# Patient Record
Sex: Female | Born: 1990 | Race: White | Hispanic: No | Marital: Single | Smoking: Never smoker
Health system: Southern US, Community
[De-identification: ages and names within clinical notes are randomized; demographics above are authoritative.]

---
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
## 2010-05-02 ENCOUNTER — Encounter: Admission: RE | Admit: 2010-05-02 | Discharge: 2010-05-02 | Payer: Self-pay | Admitting: Family Medicine

## 2012-12-11 ENCOUNTER — Other Ambulatory Visit (HOSPITAL_COMMUNITY)
Admission: RE | Admit: 2012-12-11 | Discharge: 2012-12-11 | Disposition: A | Discharge status: Home / Self Care | Payer: BC Managed Care – PPO | Source: Ambulatory Visit | Attending: Family Medicine | Admitting: Family Medicine

## 2012-12-11 DIAGNOSIS — Z124 Encounter for screening for malignant neoplasm of cervix: Secondary | ICD-10-CM | POA: Insufficient documentation

## 2015-02-11 ENCOUNTER — Other Ambulatory Visit (HOSPITAL_COMMUNITY)
Admission: RE | Admit: 2015-02-11 | Discharge: 2015-02-11 | Disposition: A | Discharge status: Home / Self Care | Payer: BC Managed Care – PPO | Source: Ambulatory Visit | Attending: Family Medicine | Admitting: Family Medicine

## 2015-02-11 ENCOUNTER — Other Ambulatory Visit: Payer: Self-pay | Admitting: Family Medicine

## 2015-02-11 DIAGNOSIS — Z124 Encounter for screening for malignant neoplasm of cervix: Secondary | ICD-10-CM | POA: Insufficient documentation

## 2015-02-14 LAB — CYTOLOGY - PAP

## 2018-05-20 ENCOUNTER — Other Ambulatory Visit: Payer: Self-pay | Admitting: Family Medicine

## 2018-05-20 ENCOUNTER — Other Ambulatory Visit (HOSPITAL_COMMUNITY)
Admission: RE | Admit: 2018-05-20 | Discharge: 2018-05-20 | Disposition: A | Discharge status: Home / Self Care | Payer: BC Managed Care – PPO | Source: Ambulatory Visit | Attending: Family Medicine | Admitting: Family Medicine

## 2018-05-20 DIAGNOSIS — Z01411 Encounter for gynecological examination (general) (routine) with abnormal findings: Secondary | ICD-10-CM | POA: Insufficient documentation

## 2018-05-22 LAB — CYTOLOGY - PAP: Diagnosis: NEGATIVE

## 2020-03-26 ENCOUNTER — Ambulatory Visit: Payer: Self-pay | Attending: Internal Medicine

## 2020-03-26 DIAGNOSIS — Z23 Encounter for immunization: Secondary | ICD-10-CM

## 2020-03-26 NOTE — Progress Notes (Signed)
   Covid-19 Vaccination Clinic  Name:  BERNEDA PICCININNI    MRN: [id number] DOB: [birth year]  03/26/2020  Ms. Gloss was observed post Covid-19 immunization for 15 minutes without incident. She was provided with Vaccine Information Sheet and instruction to access the V-Safe system.   Ms. Salmon was instructed to call 911 with any severe reactions post vaccine: Marland Kitchen Difficulty breathing  . Swelling of face and throat  . A fast heartbeat  . A bad rash all over body  . Dizziness and weakness   Immunizations Administered    Name Date Dose VIS Date Route   Pfizer COVID-19 Vaccine 03/26/2020 11:20 AM 0.3 mL 12/04/2019 Intramuscular   Manufacturer: [employer], [employer]   Lot: [id number]   NDC: [address]-2

## 2020-04-19 ENCOUNTER — Ambulatory Visit: Payer: Self-pay | Attending: Internal Medicine

## 2020-04-19 ENCOUNTER — Ambulatory Visit: Payer: Self-pay

## 2020-04-19 DIAGNOSIS — Z23 Encounter for immunization: Secondary | ICD-10-CM

## 2020-04-19 NOTE — Progress Notes (Signed)
   Covid-19 Vaccination Clinic  Name:  Nicole Arellano    MRN: [id number] DOB: 01/14/1991  04/19/2020  Ms. Nicole Arellano was observed post Covid-19 immunization for 15 minutes without incident. She was provided with Vaccine Information Sheet and instruction to access the V-Safe system.   Nicole Arellano was instructed to call 911 with any severe reactions post vaccine: Marland Kitchen Difficulty breathing  . Swelling of face and throat  . A fast heartbeat  . A bad rash all over body  . Dizziness and weakness   Immunizations Administered    Name Date Dose VIS Date Route   Pfizer COVID-19 Vaccine 04/19/2020  3:27 PM 0.3 mL 02/17/2019 Intramuscular   Manufacturer: [employer], [employer]   Lot: [id number]   NDC: [address]-7

## 2021-09-29 ENCOUNTER — Other Ambulatory Visit (HOSPITAL_COMMUNITY)
Admission: RE | Admit: 2021-09-29 | Discharge: 2021-09-29 | Disposition: A | Discharge status: Home / Self Care | Payer: No Typology Code available for payment source | Source: Ambulatory Visit | Attending: Family Medicine | Admitting: Family Medicine

## 2021-09-29 ENCOUNTER — Other Ambulatory Visit: Payer: Self-pay | Admitting: Family Medicine

## 2021-09-29 DIAGNOSIS — Z01411 Encounter for gynecological examination (general) (routine) with abnormal findings: Secondary | ICD-10-CM | POA: Diagnosis not present

## 2021-10-03 LAB — CYTOLOGY - PAP
Comment: NEGATIVE
Diagnosis: NEGATIVE
High risk HPV: NEGATIVE

## 2023-02-25 ENCOUNTER — Encounter: Payer: Self-pay | Admitting: Nurse Practitioner

## 2023-02-25 ENCOUNTER — Ambulatory Visit (INDEPENDENT_AMBULATORY_CARE_PROVIDER_SITE_OTHER): Payer: 59 | Admitting: Nurse Practitioner

## 2023-02-25 VITALS — BP 112/76 | HR 106 | Ht 66.0 in | Wt 131.2 lb

## 2023-02-25 DIAGNOSIS — Z130 Encounter for screening for diseases of the blood and blood-forming organs and certain disorders involving the immune mechanism: Secondary | ICD-10-CM

## 2023-02-25 DIAGNOSIS — R8290 Unspecified abnormal findings in urine: Secondary | ICD-10-CM

## 2023-02-25 DIAGNOSIS — Z1329 Encounter for screening for other suspected endocrine disorder: Secondary | ICD-10-CM | POA: Diagnosis not present

## 2023-02-25 DIAGNOSIS — Z1321 Encounter for screening for nutritional disorder: Secondary | ICD-10-CM | POA: Diagnosis not present

## 2023-02-25 DIAGNOSIS — Z0000 Encounter for general adult medical examination without abnormal findings: Secondary | ICD-10-CM

## 2023-02-25 DIAGNOSIS — Z13228 Encounter for screening for other metabolic disorders: Secondary | ICD-10-CM

## 2023-02-25 LAB — CBC WITH DIFFERENTIAL/PLATELET
Hematocrit: 42.5 % (ref 34.0–46.6)
Platelets: 435 10*3/uL (ref 150–450)

## 2023-02-25 LAB — POCT URINALYSIS DIP (CLINITEK)
Bilirubin, UA: NEGATIVE
Glucose, UA: NEGATIVE mg/dL
Ketones, POC UA: NEGATIVE mg/dL
Leukocytes, UA: NEGATIVE
Nitrite, UA: NEGATIVE
POC PROTEIN,UA: NEGATIVE
Spec Grav, UA: <=1.005 — AB (ref 1.010–1.025)
Urobilinogen, UA: 0.2 E.U./dL
pH, UA: 7 (ref 5.0–8.0)

## 2023-02-25 LAB — B12 AND FOLATE PANEL

## 2023-02-25 LAB — TSH

## 2023-02-25 LAB — HEMOGLOBIN A1C

## 2023-02-25 LAB — LIPID PANEL

## 2023-02-25 LAB — COMPREHENSIVE METABOLIC PANEL

## 2023-02-25 LAB — IRON,TIBC AND FERRITIN PANEL

## 2023-02-25 NOTE — Assessment & Plan Note (Signed)
Nicole Arellano has concerns with unintentional weight gain, bloating, fatigue, decreased energy, brittle nails, palpitations, and fullness on the throat that she initially noted about a year ago. She has been taking a daily supplement to help with vitamins with no change in symptoms.  Plan: Monitor labs today for possible etiology contributing to current symptoms.  Will make changes to the plan of care based on findings as appropriate.

## 2023-02-25 NOTE — Assessment & Plan Note (Signed)

## 2023-02-25 NOTE — Progress Notes (Signed)
Worthy Keeler, DNP, AGNP-c [place], [place] [address] Main Office [phone]  BP 112/76   Pulse (!) 106   Ht '5\' 6"'$  (1.676 m)   Wt 131 lb 3.2 oz (59.5 kg)   LMP 02/06/2023   BMI 21.18 kg/m    Subjective:    Patient ID: Nicole Arellano, female    DOB: Sep 14, 1991, 32 y.o.   MRN: [id number]  HPI: Nicole Arellano is a 32 y.o. female presenting on 02/25/2023 for comprehensive medical examination. She is a new patient with this practice today.   Current medical concerns include: Nicole Arellano presents today as a new patient for a physical examination and reports a significant weight gain of almost 20 pounds since last April, which she considers unusual for her. She experiences constant bloating that does not seem to be affected by dietary changes, including an unsuccessful trial of eliminating dairy and other common food allergens. Additionally, the patient is dealing with persistent fatigue that has led to the need for daily naps and a decrease in energy, preventing her from maintaining her previous running routine. She also observes that her nails have stopped growing long as they once did.  The patient has had difficulty swallowing over the past year and is uncertain if this symptom is associated with anxiety. She reports experiencing occasional palpitations while at rest, with about three episodes since January. There are no reported changes in the coloration of her fingertips. Her menstrual cycles are regular and light, attributed to her use of birth control, and she is currently on spironolactone 50 mg.   She acknowledges malodorous urine for the past few days and reports a history of frequent UTI's int he past. She is not having any dysuria, pelvic pain/pressure. low back pain.  IMMUNIZATIONS:   Flu: Flu vaccine completed elsewhere this season Prevnar 13: Prevnar 13 N/A for this patient Prevnar 20: Prevnar 20 N/A for this patient Pneumovax 23:  Pneumovax 23 N/A for this patient Vac Shingrix: Shingrix N/A for this patient HPV: HPV N/A for this patient Tetanus: Tetanus completed in the last 10 years  HEALTH MAINTENANCE: Pap Smear HM Status: is up to date Mammogram HM Status: is not applicable for this patient Colon Cancer Screening HM Status: is not applicable for this patient Bone Density HM Status: is not applicable for this patient STI Testing HM Status: was declined   She reports regular vision exams q1-5y:  No vision She reports regular dental exams q 50m  Yes  The patient eats a regular, healthy diet. She endorses exercise and/or activity of:  walking 30 minutes a day  She currently: Marital Status: single Living situation: with family Sexual: not sexually active Occupation: [employer] and exercise psychologist Pertinent items are noted in HPI.  Most Recent Depression Screen:     02/25/2023    1:52 PM  Depression screen PHQ 2/9  Decreased Interest 0  Down, Depressed, Hopeless 0  PHQ - 2 Score 0   Most Recent Anxiety Screen:      No data to display         Most Recent Fall Screen:    02/25/2023    1:52 PM  Fall Risk   Falls in the past year? 0  Number falls in past yr: 0  Injury with Fall? 0  Risk for fall due to : No Fall Risks  Follow up Falls evaluation completed    Past medical history, surgical history, medications, allergies, family history and social history reviewed with  patient today and changes made to appropriate areas of the chart.  Past Medical History:  History reviewed. No pertinent past medical history. Medications:  Current Outpatient Medications on File Prior to Visit  Medication Sig   Multiple Vitamin (MULTIVITAMIN) tablet Take 1 tablet by mouth daily.   norethindrone-ethinyl estradiol-FE (LOESTRIN FE) 1-20 MG-MCG tablet Take 1 tablet by mouth daily.   spironolactone (ALDACTONE) 50 MG tablet Take 50 mg by mouth daily.   No current facility-administered medications on file prior to visit.    Surgical History:  History reviewed. No pertinent surgical history. Allergies:  Not on File Family History:  History reviewed. No pertinent family history.     Objective:    BP 112/76   Pulse (!) 106   Ht '5\' 6"'$  (1.676 m)   Wt 131 lb 3.2 oz (59.5 kg)   LMP 02/06/2023   BMI 21.18 kg/m   Wt Readings from Last 3 Encounters:  02/25/23 131 lb 3.2 oz (59.5 kg)    Physical Exam Vitals and nursing note reviewed.  Constitutional:      General: She is not in acute distress.    Appearance: Normal appearance.  HENT:     Head: Normocephalic and atraumatic.     Right Ear: Hearing, tympanic membrane, ear canal and external ear normal.     Left Ear: Hearing, tympanic membrane, ear canal and external ear normal.     Nose: Nose normal.     Right Sinus: No maxillary sinus tenderness or frontal sinus tenderness.     Left Sinus: No maxillary sinus tenderness or frontal sinus tenderness.     Mouth/Throat:     Lips: Pink.     Mouth: Mucous membranes are moist.     Pharynx: Oropharynx is clear.  Eyes:     General: Lids are normal. Vision grossly intact.     Extraocular Movements: Extraocular movements intact.     Conjunctiva/sclera: Conjunctivae normal.     Pupils: Pupils are equal, round, and reactive to light.     Funduscopic exam:    Right eye: Red reflex present.        Left eye: Red reflex present.    Visual Fields: Right eye visual fields normal and left eye visual fields normal.  Neck:     Thyroid: No thyromegaly.     Vascular: No carotid bruit.  Cardiovascular:     Rate and Rhythm: Normal rate and regular rhythm.     Chest Wall: PMI is not displaced.     Pulses: Normal pulses.          Dorsalis pedis pulses are 2+ on the right side and 2+ on the left side.       Posterior tibial pulses are 2+ on the right side and 2+ on the left side.     Heart sounds: Normal heart sounds. No murmur heard. Pulmonary:     Effort: Pulmonary effort is normal. No respiratory distress.      Breath sounds: Normal breath sounds.  Abdominal:     General: Abdomen is flat. Bowel sounds are normal. There is no distension.     Palpations: Abdomen is soft. There is no hepatomegaly, splenomegaly or mass.     Tenderness: There is no abdominal tenderness. There is no right CVA tenderness, left CVA tenderness, guarding or rebound.  Musculoskeletal:        General: Normal range of motion.     Cervical back: Full passive range of motion without pain, normal range of motion  and neck supple. No tenderness.     Right lower leg: No edema.     Left lower leg: No edema.  Feet:     Left foot:     Toenail Condition: Left toenails are normal.  Lymphadenopathy:     Cervical: No cervical adenopathy.     Upper Body:     Right upper body: No supraclavicular adenopathy.     Left upper body: No supraclavicular adenopathy.  Skin:    General: Skin is warm and dry.     Capillary Refill: Capillary refill takes less than 2 seconds.     Nails: There is no clubbing.  Neurological:     General: No focal deficit present.     Mental Status: She is alert and oriented to person, place, and time.     GCS: GCS eye subscore is 4. GCS verbal subscore is 5. GCS motor subscore is 6.     Sensory: Sensation is intact.     Motor: Motor function is intact.     Coordination: Coordination is intact.     Gait: Gait is intact.     Deep Tendon Reflexes: Reflexes are normal and symmetric.  Psychiatric:        Attention and Perception: Attention normal.        Mood and Affect: Mood normal.        Speech: Speech normal.        Behavior: Behavior normal. Behavior is cooperative.        Thought Content: Thought content normal.        Cognition and Memory: Cognition and memory normal.        Judgment: Judgment normal.     Results for orders placed or performed in visit on 02/25/23  POCT URINALYSIS DIP (CLINITEK)  Result Value Ref Range   Color, UA yellow yellow   Clarity, UA clear clear   Glucose, UA negative  negative mg/dL   Bilirubin, UA negative negative   Ketones, POC UA negative negative mg/dL   Spec Grav, UA <=1.005 (A) 1.010 - 1.025   Blood, UA small (A) negative   pH, UA 7.0 5.0 - 8.0   POC PROTEIN,UA negative negative, trace   Urobilinogen, UA 0.2 0.2 or 1.0 E.U./dL   Nitrite, UA Negative Negative   Leukocytes, UA Negative Negative         Assessment & Plan:   Problem List Items Addressed This Visit     Encounter for annual physical exam - Primary    CPE today with no abnormalities noted on exam.  Labs pending. Will make changes as necessary based on results.  Review of HM activities and recommendations discussed and provided on AVS Anticipatory guidance, diet, and exercise recommendations provided.  Medications, allergies, and hx reviewed and updated as necessary.  Plan to f/u with CPE in 1 year or sooner for acute/chronic health needs as directed.        Screening for endocrine, nutritional, metabolic and immunity disorder    Aaditri has concerns with unintentional weight gain, bloating, fatigue, decreased energy, brittle nails, palpitations, and fullness on the throat that she initially noted about a year ago. She has been taking a daily supplement to help with vitamins with no change in symptoms.  Plan: Monitor labs today for possible etiology contributing to current symptoms.  Will make changes to the plan of care based on findings as appropriate.       Relevant Orders   CBC with Differential/Platelet   Comprehensive metabolic  panel   Lipid panel   Hemoglobin A1c   TSH   T4, free   VITAMIN D 25 Hydroxy (Vit-D Deficiency, Fractures)   B12 and Folate Panel   Iron, TIBC and Ferritin Panel   Urine malodor    Alteration in urine odor with no other associated symptoms. Plan: UA today for evaluation.       Relevant Orders   POCT URINALYSIS DIP (CLINITEK) (Completed)   Other Visit Diagnoses     Health care maintenance       Relevant Orders   CBC with  Differential/Platelet   Comprehensive metabolic panel   Lipid panel   Hemoglobin A1c   TSH   T4, free   VITAMIN D 25 Hydroxy (Vit-D Deficiency, Fractures)   B12 and Folate Panel   Iron, TIBC and Ferritin Panel          Follow up plan: Return in about 1 year (around 02/25/2024) for CPE.  NEXT PREVENTATIVE PHYSICAL DUE IN 1 YEAR.  PATIENT COUNSELING PROVIDED FOR ALL ADULT PATIENTS:  Consume a well balanced diet low in saturated fats, cholesterol, and moderation in carbohydrates.   This can be as simple as monitoring portion sizes and cutting back on sugary beverages such as soda and juice to start with.    Daily water consumption of at least 64 ounces.  Physical activity at least 180 minutes per week, if just starting out.   This can be as simple as taking the stairs instead of the elevator and walking 2-3 laps around the office  purposefully every day.   STD protection, partner selection, and regular testing if high risk.  Limited consumption of alcoholic beverages if alcohol is consumed.  For women, I recommend no more than 7 alcoholic beverages per week, spread out throughout the week.  Avoid "binge" drinking or consuming large quantities of alcohol in one setting.   Please let me know if you feel you may need help with reduction or quitting alcohol consumption.   Avoidance of nicotine, if used.  Please let me know if you feel you may need help with reduction or quitting nicotine use.   Daily mental health attention.  This can be in the form of 5 minute daily meditation, prayer, journaling, yoga, reflection, etc.   Purposeful attention to your emotions and mental state can significantly improve your overall wellbeing and Health.  Please know that I am here to help you with all of your health care goals and am happy to work with you to find a solution that works best for you.  The greatest advice I have received with any changes in life are to take it one step at a time,  that even means if all you can focus on is the next 60 seconds, then do that and celebrate your victories.  With any changes in life, you will have set backs, and that is OK. The important thing to remember is, if you have a set back, it is not a failure, it is an opportunity to try again!  Health Maintenance Recommendations Screening Testing Mammogram Every 1 -2 years based on history and risk factors Starting at age 56 Pap Smear Ages 21-39 every 3 years Ages 43-65 every 5 years with HPV testing More frequent testing may be required based on results and history Colon Cancer Screening Every 1-10 years based on test performed, risk factors, and history Starting at age 29 Bone Density Screening Every 2-10 years based on history Starting at age 64  for women Recommendations for men differ based on medication usage, history, and risk factors AAA Screening One time ultrasound Men 13-30 years old who have every smoked Lung Cancer Screening Low Dose Lung CT every 12 months Age 9-80 years with a 30 pack-year smoking history who still smoke or who have quit within the last 15 years  Screening Labs Routine  Labs: Complete Blood Count (CBC), Complete Metabolic Panel (CMP), Cholesterol (Lipid Panel) Every 6-12 months based on history and medications May be recommended more frequently based on current conditions or previous results Hemoglobin A1c Lab Every 3-12 months based on history and previous results Starting at age 44 or earlier with diagnosis of diabetes, high cholesterol, BMI >26, and/or risk factors Frequent monitoring for patients with diabetes to ensure blood sugar control Thyroid Panel (TSH w/ T3 & T4) Every 6 months based on history, symptoms, and risk factors May be repeated more often if on medication HIV One time testing for all patients 49 and older May be repeated more frequently for patients with increased risk factors or exposure Hepatitis C One time testing for all  patients 64 and older May be repeated more frequently for patients with increased risk factors or exposure Gonorrhea, Chlamydia Every 12 months for all sexually active persons 13-24 years Additional monitoring may be recommended for those who are considered high risk or who have symptoms PSA Men 24-21 years old with risk factors Additional screening may be recommended from age 47-69 based on risk factors, symptoms, and history  Vaccine Recommendations Tetanus Booster All adults every 10 years Flu Vaccine All patients 6 months and older every year COVID Vaccine All patients 12 years and older Initial dosing with booster May recommend additional booster based on age and health history HPV Vaccine 2 doses all patients age 36-26 Dosing may be considered for patients over 26 Shingles Vaccine (Shingrix) 2 doses all adults 38 years and older Pneumonia (Pneumovax 23) All adults 29 years and older May recommend earlier dosing based on health history Pneumonia (Prevnar 64) All adults 16 years and older Dosed 1 year after Pneumovax 23  Additional Screening, Testing, and Vaccinations may be recommended on an individualized basis based on family history, health history, risk factors, and/or exposure.

## 2023-02-25 NOTE — Assessment & Plan Note (Signed)
Alteration in urine odor with no other associated symptoms. Plan: UA today for evaluation.

## 2023-02-26 LAB — CBC WITH DIFFERENTIAL/PLATELET
Basophils Absolute: 0.1 10*3/uL (ref 0.0–0.2)
Basos: 1 %
EOS (ABSOLUTE): 0.2 10*3/uL (ref 0.0–0.4)
Eos: 3 %
Hemoglobin: 14.3 g/dL (ref 11.1–15.9)
Immature Grans (Abs): 0 10*3/uL (ref 0.0–0.1)
Immature Granulocytes: 0 %
Lymphocytes Absolute: 2.5 10*3/uL (ref 0.7–3.1)
Lymphs: 35 %
MCH: 30.7 pg (ref 26.6–33.0)
MCHC: 33.6 g/dL (ref 31.5–35.7)
MCV: 91 fL (ref 79–97)
Monocytes Absolute: 0.5 10*3/uL (ref 0.1–0.9)
Monocytes: 6 %
Neutrophils Absolute: 3.8 10*3/uL (ref 1.4–7.0)
Neutrophils: 55 %
RBC: 4.66 x10E6/uL (ref 3.77–5.28)
RDW: 12.8 % (ref 11.7–15.4)
WBC: 7.1 10*3/uL (ref 3.4–10.8)

## 2023-02-26 LAB — COMPREHENSIVE METABOLIC PANEL
ALT: 13 IU/L (ref 0–32)
AST: 18 IU/L (ref 0–40)
Albumin/Globulin Ratio: 1.8 (ref 1.2–2.2)
Alkaline Phosphatase: 61 IU/L (ref 44–121)
BUN/Creatinine Ratio: 12 (ref 9–23)
BUN: 9 mg/dL (ref 6–20)
Bilirubin Total: 0.7 mg/dL (ref 0.0–1.2)
CO2: 21 mmol/L (ref 20–29)
Calcium: 9.4 mg/dL (ref 8.7–10.2)
Chloride: 100 mmol/L (ref 96–106)
Creatinine, Ser: 0.77 mg/dL (ref 0.57–1.00)
Globulin, Total: 2.6 g/dL (ref 1.5–4.5)
Glucose: 80 mg/dL (ref 70–99)
Potassium: 4.5 mmol/L (ref 3.5–5.2)
Sodium: 140 mmol/L (ref 134–144)
Total Protein: 7.3 g/dL (ref 6.0–8.5)
eGFR: 106 mL/min/{1.73_m2} (ref >59)

## 2023-02-26 LAB — LIPID PANEL
Chol/HDL Ratio: 3.6 ratio (ref 0.0–4.4)
Cholesterol, Total: 211 mg/dL — ABNORMAL HIGH (ref 100–199)
LDL Chol Calc (NIH): 132 mg/dL — ABNORMAL HIGH (ref 0–99)
VLDL Cholesterol Cal: 21 mg/dL (ref 5–40)

## 2023-02-26 LAB — HEMOGLOBIN A1C: Est. average glucose Bld gHb Est-mCnc: 97 mg/dL

## 2023-02-26 LAB — T4, FREE: Free T4: 1.39 ng/dL (ref 0.82–1.77)

## 2023-02-26 LAB — VITAMIN D 25 HYDROXY (VIT D DEFICIENCY, FRACTURES): Vit D, 25-Hydroxy: 40.7 ng/mL (ref 30.0–100.0)

## 2023-02-26 LAB — IRON,TIBC AND FERRITIN PANEL
Ferritin: 77 ng/mL (ref 15–150)
Iron Saturation: 37 % (ref 15–55)
Iron: 153 ug/dL (ref 27–159)
UIBC: 262 ug/dL (ref 131–425)

## 2023-02-28 ENCOUNTER — Encounter: Payer: Self-pay | Admitting: Nurse Practitioner

## 2023-03-04 ENCOUNTER — Other Ambulatory Visit (INDEPENDENT_AMBULATORY_CARE_PROVIDER_SITE_OTHER): Payer: 59

## 2023-03-04 DIAGNOSIS — R7989 Other specified abnormal findings of blood chemistry: Secondary | ICD-10-CM | POA: Diagnosis not present

## 2023-03-04 DIAGNOSIS — D519 Vitamin B12 deficiency anemia, unspecified: Secondary | ICD-10-CM | POA: Diagnosis not present

## 2023-03-04 MED ORDER — CYANOCOBALAMIN 1000 MCG/ML IJ SOLN
1000.0000 ug | Freq: Once | INTRAMUSCULAR | Status: AC
  Administered 2023-03-04: 1000 ug via INTRAMUSCULAR

## 2023-03-28 ENCOUNTER — Telehealth: Payer: Self-pay | Admitting: Nurse Practitioner

## 2023-03-28 ENCOUNTER — Encounter: Payer: Self-pay | Admitting: Nurse Practitioner

## 2023-03-28 ENCOUNTER — Other Ambulatory Visit: Payer: Self-pay | Admitting: Nurse Practitioner

## 2023-03-28 NOTE — Telephone Encounter (Signed)
error 

## 2023-03-28 NOTE — Telephone Encounter (Signed)
Hey pt left  a vm asking for a refill on her birth control to [id number] - [place], [place]

## 2023-04-01 ENCOUNTER — Other Ambulatory Visit: Payer: Self-pay

## 2023-04-01 ENCOUNTER — Other Ambulatory Visit (HOSPITAL_BASED_OUTPATIENT_CLINIC_OR_DEPARTMENT_OTHER): Payer: Self-pay

## 2023-04-01 MED ORDER — NORETHIN ACE-ETH ESTRAD-FE 1-20 MG-MCG PO TABS: 1.0000 | ORAL_TABLET | Freq: Every day | ORAL | 3 refills | Status: DC

## 2023-04-01 MED ORDER — NORETHIN ACE-ETH ESTRAD-FE 1-20 MG-MCG PO TABS
1.0000 | ORAL_TABLET | Freq: Every day | ORAL | 3 refills | Status: DC
  Filled 2023-04-01: qty 28, 28d supply, fill #0

## 2023-04-05 ENCOUNTER — Other Ambulatory Visit (INDEPENDENT_AMBULATORY_CARE_PROVIDER_SITE_OTHER): Payer: 59

## 2023-04-05 DIAGNOSIS — E538 Deficiency of other specified B group vitamins: Secondary | ICD-10-CM

## 2023-04-05 MED ORDER — CYANOCOBALAMIN 1000 MCG/ML IJ SOLN
1000.0000 ug | Freq: Once | INTRAMUSCULAR | Status: AC
  Administered 2023-04-05: 1000 ug via INTRAMUSCULAR

## 2023-04-23 MED ORDER — NORETHINDRONE ACET-ETHINYL EST 1-20 MG-MCG PO TABS: 1.0000 | ORAL_TABLET | Freq: Every day | ORAL | 3 refills | Status: DC

## 2023-04-29 ENCOUNTER — Other Ambulatory Visit (INDEPENDENT_AMBULATORY_CARE_PROVIDER_SITE_OTHER): Payer: 59

## 2023-04-29 DIAGNOSIS — E538 Deficiency of other specified B group vitamins: Secondary | ICD-10-CM

## 2023-04-29 MED ORDER — CYANOCOBALAMIN 1000 MCG/ML IJ SOLN
1000.0000 ug | Freq: Once | INTRAMUSCULAR | Status: AC
  Administered 2023-04-29: 1000 ug via INTRAMUSCULAR

## 2023-05-15 ENCOUNTER — Encounter: Payer: Self-pay | Admitting: Nurse Practitioner

## 2023-05-16 ENCOUNTER — Other Ambulatory Visit: Payer: Self-pay

## 2023-05-16 MED ORDER — CYANOCOBALAMIN 1000 MCG/ML IJ SOLN: 1000.0000 ug | INTRAMUSCULAR | 1 refills | Status: AC

## 2023-09-01 ENCOUNTER — Encounter: Payer: Self-pay | Admitting: Nurse Practitioner

## 2023-09-02 ENCOUNTER — Other Ambulatory Visit: Payer: Self-pay

## 2023-09-02 MED ORDER — SPIRONOLACTONE 50 MG PO TABS: 50.0000 mg | ORAL_TABLET | Freq: Every day | ORAL | 1 refills | Status: DC

## 2023-10-24 ENCOUNTER — Other Ambulatory Visit: Payer: Self-pay

## 2023-11-01 ENCOUNTER — Other Ambulatory Visit: Payer: Self-pay

## 2023-11-01 ENCOUNTER — Encounter: Payer: Self-pay | Admitting: Nurse Practitioner

## 2023-11-01 MED ORDER — NORETHINDRONE ACET-ETHINYL EST 1-20 MG-MCG PO TABS: 1.0000 | ORAL_TABLET | Freq: Every day | ORAL | 0 refills | Status: DC

## 2024-01-22 ENCOUNTER — Other Ambulatory Visit: Payer: Self-pay | Admitting: Nurse Practitioner

## 2024-02-07 ENCOUNTER — Other Ambulatory Visit: Payer: Self-pay | Admitting: Nurse Practitioner

## 2024-03-02 ENCOUNTER — Encounter: Payer: 59 | Admitting: Nurse Practitioner

## 2024-04-19 ENCOUNTER — Other Ambulatory Visit: Payer: Self-pay | Admitting: Nurse Practitioner

## 2024-05-04 ENCOUNTER — Other Ambulatory Visit: Payer: Self-pay

## 2024-05-04 MED ORDER — NORETHINDRONE ACET-ETHINYL EST 1-20 MG-MCG PO TABS: 1.0000 | ORAL_TABLET | Freq: Every day | ORAL | 0 refills | Status: DC

## 2024-05-04 MED ORDER — SPIRONOLACTONE 50 MG PO TABS: 50.0000 mg | ORAL_TABLET | Freq: Every day | ORAL | 0 refills | Status: DC

## 2024-05-04 NOTE — Telephone Encounter (Signed)
 Copied from CRM [id number]. Topic: Clinical - Medication Question >> May 04, 2024  2:56 PM Everlene Hobby D wrote:  spironolactone  (ALDACTONE ) 50 MG tablet norethindrone -ethinyl estradiol  (LOESTRIN ) 1-20 MG-MCG tablet  [facility] [address] [place], [address] >> May 04, 2024  2:56 PM Everlene Hobby D wrote: Thurston Flow the medications and pharmacy have been confirmed

## 2024-05-04 NOTE — Telephone Encounter (Signed)
 Called pt. Back had LM to see what medications she needed a refill on because it was not in the message. I can refill her meds until her next appt. Which is in July. I just need to know what medication and which pharmacy. Thanks.   Copied from CRM [id number]. Topic: Clinical - Prescription Issue >> May 04, 2024 12:36 PM Heather  M wrote: Reason for CRM: Patient needs a refill but was not able to schedule med refill appointment until July and physical not available until December. Scheduled both and added both to wait list. Please call and advise what to do about refills in the mean time.

## 2024-05-11 ENCOUNTER — Other Ambulatory Visit: Payer: Self-pay

## 2024-05-11 MED ORDER — SPIRONOLACTONE 50 MG PO TABS: 50.0000 mg | ORAL_TABLET | Freq: Every day | ORAL | 0 refills | Status: DC

## 2024-05-11 MED ORDER — NORETHINDRONE ACET-ETHINYL EST 1-20 MG-MCG PO TABS: 1.0000 | ORAL_TABLET | Freq: Every day | ORAL | 0 refills | Status: DC

## 2024-06-25 ENCOUNTER — Ambulatory Visit: Admitting: Nurse Practitioner

## 2024-07-28 ENCOUNTER — Other Ambulatory Visit: Payer: Self-pay | Admitting: Nurse Practitioner

## 2024-08-03 ENCOUNTER — Other Ambulatory Visit: Payer: Self-pay | Admitting: Nurse Practitioner

## 2024-08-11 ENCOUNTER — Other Ambulatory Visit: Payer: Self-pay | Admitting: Nurse Practitioner

## 2024-08-11 NOTE — Telephone Encounter (Signed)
 Last Fill: 07/28/24 Pt requesting alternative pharmacy  Routing to provider for review/authorization.

## 2024-08-11 NOTE — Telephone Encounter (Signed)
 Copied from CRM #[id number]. Topic: Clinical - Medication Refill >> Aug 11, 2024  9:25 AM Suzen RAMAN wrote: Medication: norethindrone -ethinyl estradiol  (LOESTRIN  1/20, 21,) 1-20 MG-MCG tablet  Has the patient contacted their pharmacy? Yes (Agent: If no, request that the patient contact the pharmacy for the refill. If patient does not wish to contact the pharmacy document the reason why and proceed with request.) (Agent: If yes, when and what did the pharmacy advise?)  This is the patient's preferred pharmacy:   [id number] - [place], [place] - [address] [place] [address] Phone: [phone] Fax: [phone]  Is this the correct pharmacy for this prescription? Yes If no, delete pharmacy and type the correct one.   Has the prescription been filled recently? No  Is the patient out of the medication? Yes  Has the patient been seen for an appointment in the last year OR does the patient have an upcoming appointment? Yes  Can we respond through MyChart? Yes  Agent: Please be advised that Rx refills may take up to 3 business days. We ask that you follow-up with your pharmacy.

## 2024-10-27 ENCOUNTER — Other Ambulatory Visit: Payer: Self-pay | Admitting: Nurse Practitioner

## 2024-11-10 ENCOUNTER — Encounter: Payer: Self-pay | Admitting: Nurse Practitioner

## 2024-11-10 DIAGNOSIS — B379 Candidiasis, unspecified: Secondary | ICD-10-CM

## 2024-11-11 ENCOUNTER — Other Ambulatory Visit: Payer: Self-pay

## 2024-11-11 MED ORDER — NITROFURANTOIN MONOHYD MACRO 100 MG PO CAPS: 100.0000 mg | ORAL_CAPSULE | Freq: Two times a day (BID) | ORAL | 3 refills | Status: AC

## 2024-11-16 MED ORDER — FLUCONAZOLE 150 MG PO TABS: 150.0000 mg | ORAL_TABLET | Freq: Once | ORAL | 0 refills | Status: DC

## 2024-11-25 NOTE — Progress Notes (Deleted)
 Flu: Covid: T-dap: HPV:

## 2024-12-01 ENCOUNTER — Encounter: Payer: Self-pay | Admitting: Nurse Practitioner

## 2024-12-29 ENCOUNTER — Other Ambulatory Visit: Payer: Self-pay | Admitting: Nurse Practitioner

## 2024-12-29 DIAGNOSIS — B379 Candidiasis, unspecified: Secondary | ICD-10-CM

## 2024-12-30 NOTE — Telephone Encounter (Signed)
 Not in current med list

## 2025-05-24 ENCOUNTER — Encounter: Admitting: Nurse Practitioner
